# Patient Record
Sex: Male | Born: 1970 | Race: Black or African American | Hispanic: No | Marital: Single | Smoking: Never smoker
Health system: Southern US, Community
[De-identification: ages and names within clinical notes are randomized; demographics above are authoritative.]

## PROBLEM LIST (undated history)

## (undated) HISTORY — PX: OTHER SURGICAL HISTORY: SHX169

---

## 2008-06-24 ENCOUNTER — Emergency Department (HOSPITAL_COMMUNITY): Admission: EM | Admit: 2008-06-24 | Discharge: 2008-06-24 | Payer: Self-pay | Admitting: Emergency Medicine

## 2008-11-05 ENCOUNTER — Emergency Department (HOSPITAL_COMMUNITY): Admission: EM | Admit: 2008-11-05 | Discharge: 2008-11-05 | Payer: Self-pay | Admitting: Emergency Medicine

## 2010-07-07 LAB — POCT I-STAT, CHEM 8
BUN: 16 mg/dL (ref 6–23)
Creatinine, Ser: 1.1 mg/dL (ref 0.4–1.5)
Potassium: 3.6 mEq/L (ref 3.5–5.1)
Sodium: 141 mEq/L (ref 135–145)

## 2010-07-07 LAB — URINALYSIS, ROUTINE W REFLEX MICROSCOPIC
Bilirubin Urine: NEGATIVE
Hgb urine dipstick: NEGATIVE
Specific Gravity, Urine: 1.027 (ref 1.005–1.030)
Urobilinogen, UA: 1 mg/dL (ref 0.0–1.0)
pH: 6.5 (ref 5.0–8.0)

## 2010-07-07 LAB — URINE CULTURE: Colony Count: NO GROWTH

## 2010-07-07 LAB — CBC
Platelets: 297 10*3/uL (ref 150–400)
WBC: 3.9 10*3/uL — ABNORMAL LOW (ref 4.0–10.5)

## 2010-07-07 LAB — DIFFERENTIAL
Lymphocytes Relative: 44 % (ref 12–46)
Lymphs Abs: 1.7 10*3/uL (ref 0.7–4.0)
Neutrophils Relative %: 44 % (ref 43–77)

## 2011-10-05 ENCOUNTER — Ambulatory Visit: Payer: BC Managed Care – PPO | Admitting: Emergency Medicine

## 2011-10-05 ENCOUNTER — Ambulatory Visit (INDEPENDENT_AMBULATORY_CARE_PROVIDER_SITE_OTHER): Payer: BC Managed Care – PPO | Admitting: Emergency Medicine

## 2011-10-05 VITALS — BP 120/80 | HR 88 | Temp 98.3°F | Resp 16 | Ht 67.5 in | Wt 190.0 lb

## 2011-10-05 DIAGNOSIS — M654 Radial styloid tenosynovitis [de Quervain]: Secondary | ICD-10-CM

## 2011-10-05 DIAGNOSIS — M25539 Pain in unspecified wrist: Secondary | ICD-10-CM

## 2011-10-05 NOTE — Progress Notes (Signed)
  Subjective:    Patient ID: Ronnie Michael, male    DOB: 01/04/1971, 41 y.o.   MRN: [id number]  Wrist Pain  The pain is present in the left wrist. This is a new problem. The current episode started in the past 7 days. There has been no history of extremity trauma. The problem occurs constantly. The problem has been gradually worsening. The quality of the pain is described as burning and aching. The pain is moderate. Pertinent negatives include no fever, inability to bear weight, itching, joint locking, joint swelling, limited range of motion, numbness, stiffness or tingling. The symptoms are aggravated by activity. He has tried acetaminophen for the symptoms. The treatment provided no relief. Family history does not include gout or rheumatoid arthritis. There is no history of diabetes, gout, osteoarthritis or rheumatoid arthritis.      Review of Systems  Constitutional: Negative for fever.  Musculoskeletal: Negative for stiffness and gout.  Skin: Negative for itching.  Neurological: Negative for tingling and numbness.  All other systems reviewed and are negative.       Objective:   Physical Exam  Constitutional: He is oriented to person, place, and time. He appears well-developed and well-nourished.  HENT:  Head: Normocephalic and atraumatic.  Eyes: Conjunctivae are normal. Pupils are equal, round, and reactive to light. No scleral icterus.  Neck: Normal range of motion. Neck supple.  Cardiovascular: Normal rate.   Pulmonary/Chest: Effort normal.  Musculoskeletal: He exhibits tenderness (positive Finklestein test).  Neurological: He is alert and oriented to person, place, and time.  Skin: Skin is warm and dry.          Assessment & Plan:  Dequervain's tenosynovitis Thumb spica Anaprox folllow up in one week

## 2011-10-08 ENCOUNTER — Telehealth: Payer: Self-pay

## 2011-10-08 NOTE — Telephone Encounter (Signed)
The patient called to request return call from Dr. Dareen Piano regarding his right wrist injury and his disability claim.  Please call patient at [phone].

## 2011-10-09 NOTE — Telephone Encounter (Signed)
Pt just wanted Dr Dareen Piano to know that employer will be getting in touch w/him regarding his disability. Pt will RTC for f/up on Friday as instr'd.

## 2011-10-11 ENCOUNTER — Ambulatory Visit: Payer: BC Managed Care – PPO

## 2011-10-11 ENCOUNTER — Ambulatory Visit (INDEPENDENT_AMBULATORY_CARE_PROVIDER_SITE_OTHER): Payer: BC Managed Care – PPO | Admitting: Emergency Medicine

## 2011-10-11 VITALS — BP 110/70 | HR 74 | Temp 97.6°F | Resp 16 | Ht 67.5 in | Wt 195.8 lb

## 2011-10-11 DIAGNOSIS — M654 Radial styloid tenosynovitis [de Quervain]: Secondary | ICD-10-CM

## 2011-10-11 NOTE — Progress Notes (Signed)
  Subjective:    Patient ID: Ronnie Michael, male    DOB: 1970/12/22, 41 y.o.   MRN: [id number]  HPI No improvement with one week of immobilization.  Claims not to be using his arm or hand without the splint. Compliant with meds   Review of Systems    As per HPI, otherwise negative.   Objective:   Physical Exam   GEN: WDWN, NAD, Non-toxic, Alert & Oriented x 3 HEENT: Atraumatic, Normocephalic.  Ears and Nose: No external deformity. EXTR: No clubbing/cyanosis/edema NEURO: Normal gait.  PSYCH: Normally interactive. Conversant. Not depressed or anxious appearing.  Calm demeanor.  Wrist tender over right radial aspect.  Positive Finklestein test      Assessment & Plan:  Lollie Sails. Xray Continue spliint  Follow up  Dr Neva Seat 1 week.  [facility] reading (PRIMARY) by  Dr. Dareen Piano.  Negative .

## 2011-10-18 ENCOUNTER — Encounter: Payer: Self-pay | Admitting: Family Medicine

## 2011-10-18 ENCOUNTER — Ambulatory Visit (INDEPENDENT_AMBULATORY_CARE_PROVIDER_SITE_OTHER): Payer: BC Managed Care – PPO | Admitting: Family Medicine

## 2011-10-18 VITALS — BP 108/69 | HR 77 | Temp 97.8°F | Resp 18 | Ht 66.5 in | Wt 186.0 lb

## 2011-10-18 DIAGNOSIS — M654 Radial styloid tenosynovitis [de Quervain]: Secondary | ICD-10-CM

## 2011-10-18 DIAGNOSIS — R209 Unspecified disturbances of skin sensation: Secondary | ICD-10-CM

## 2011-10-18 DIAGNOSIS — R208 Other disturbances of skin sensation: Secondary | ICD-10-CM

## 2011-10-18 DIAGNOSIS — M25539 Pain in unspecified wrist: Secondary | ICD-10-CM

## 2011-10-18 NOTE — Progress Notes (Signed)
  Subjective:    Patient ID: Ronnie Michael, male    DOB: [birth year], 41 y.o.   MRN: [id number]  HPI Ronnie Michael is a 41 y.o. male Seen by Dr. Dareen Piano 10/05/11.- dx with component of Dequervain's tenosynovitis, rx Naprosyn and thumb spica bracing. 7/12 follow up with Dr. Dareen Piano - no improvement. Xray report:There is some deformity of the distal right ulna which appears old and may be related to prior trauma. The radiocarpal joint space is unremarkable. No acute abnormality is seen. Also an old injury of the ligament between the lunate and navicula is a consideration with somewhat widened AC joint space.   Has had R wrist pain few months without apparent injury.  After workout 3 weeks ago - noticed more pain 2 days later, but no acute injury during workout known.  Losing sensation feeling in top of wrist, and into thumb about 2 weeks ago, sensation came back 2 nights ago.  Noticed when moving in shower.  No other injury.  No change in type of work recently.  Sometimes pain better if not moving wrist and in splint. Notices soreness if out of splint.  Pain with any movement.   Taking Naprosyn BID.   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  On short term disability -past 4 days.   Review of Systems  Musculoskeletal: Positive for myalgias (proximal from wrist and into thumb at times. ).  Skin: Negative for rash and wound.  Neurological: Positive for weakness (feels like r hand grasp weaker, and dysesthesias per HPI. ).       Objective:   Physical Exam  Constitutional: He is oriented to person, place, and time. He appears well-developed and well-nourished.  HENT:  Head: Normocephalic and atraumatic.  Pulmonary/Chest: Effort normal.  Musculoskeletal:       Right wrist: He exhibits decreased range of motion, tenderness and bony tenderness. He exhibits no swelling, no effusion and no deformity.       Arms: Neurological: He is alert and oriented to person, place, and time. No sensory  deficit (fingertips warm, cap refill less than 1 second. ).    Prior office visits and xray report reviewed.       Assessment & Plan:  Ronnie Michael is a 41 y.o. male  1. Wrist pain  Ambulatory referral to Orthopedic Surgery  2. Tenosynovitis, de Quervain  Ambulatory referral to Orthopedic Surgery  3. Dysesthesia  Ambulatory referral to Orthopedic Surgery    R wrist pain in multiple areas - scapholunate area - possible old injury with widening on XR.  Also with findings suggestive of Dequervain's, and carpal tunnel syndrome with dysesthesias and tinel's.  Replaced thumb spica fiberglass splint with velcro removable thumb spica splint.  Refer to Hand surgeon/ortho for further eval (+/-MRI, +/- trial of PT) as minimal improvement with almost 2 weeks of immobilization and multiple areas of involvement of pain and reported dyesthesias and weakness.  Can continue naprosyn BID prn, rom BID as tolerated and if recurrence or worsening of dysesthesias - rtc or ER.  Understanding expressed.

## 2011-10-18 NOTE — Patient Instructions (Signed)
We will refer you to an orthopaedic /hand specialist.  Can use wrist brace as needed.  Minimize use of R wrist, but should try to take wrist out of splint twice per day for gentle range of motion.  Ok to continue naprosyn twice per day as needed.  Take this with food. Return to the clinic or go to the nearest emergency room if any of your symptoms worsen or new symptoms occur.

## 2015-11-29 ENCOUNTER — Encounter (HOSPITAL_COMMUNITY): Payer: Self-pay | Admitting: Emergency Medicine

## 2015-11-29 ENCOUNTER — Emergency Department (HOSPITAL_COMMUNITY)
Admission: EM | Admit: 2015-11-29 | Discharge: 2015-11-29 | Disposition: A | Discharge status: Home / Self Care | Payer: Self-pay | Attending: Emergency Medicine | Admitting: Emergency Medicine

## 2015-11-29 ENCOUNTER — Emergency Department (HOSPITAL_COMMUNITY): Payer: Self-pay

## 2015-11-29 DIAGNOSIS — Y990 Civilian activity done for income or pay: Secondary | ICD-10-CM | POA: Insufficient documentation

## 2015-11-29 DIAGNOSIS — M25462 Effusion, left knee: Secondary | ICD-10-CM | POA: Insufficient documentation

## 2015-11-29 DIAGNOSIS — Y929 Unspecified place or not applicable: Secondary | ICD-10-CM | POA: Insufficient documentation

## 2015-11-29 DIAGNOSIS — Y9389 Activity, other specified: Secondary | ICD-10-CM | POA: Insufficient documentation

## 2015-11-29 DIAGNOSIS — X58XXXA Exposure to other specified factors, initial encounter: Secondary | ICD-10-CM | POA: Insufficient documentation

## 2015-11-29 MED ORDER — TRAMADOL HCL 50 MG PO TABS: 50.0000 mg | ORAL_TABLET | Freq: Four times a day (QID) | ORAL | 0 refills | Status: AC | PRN

## 2015-11-29 MED ORDER — HYDROCODONE-ACETAMINOPHEN 5-325 MG PO TABS
1.0000 | ORAL_TABLET | Freq: Once | ORAL | Status: AC
  Administered 2015-11-29: 1 via ORAL
  Filled 2015-11-29: qty 1

## 2015-11-29 MED ORDER — IBUPROFEN 200 MG PO TABS
600.0000 mg | ORAL_TABLET | Freq: Once | ORAL | Status: AC
  Administered 2015-11-29: 600 mg via ORAL
  Filled 2015-11-29: qty 3

## 2015-11-29 MED ORDER — IBUPROFEN 800 MG PO TABS: 800.0000 mg | ORAL_TABLET | Freq: Three times a day (TID) | ORAL | 0 refills | Status: AC

## 2015-11-29 NOTE — ED Notes (Signed)
Bed: WA26 Expected date:  Expected time:  Means of arrival:  Comments: 

## 2015-11-29 NOTE — ED Triage Notes (Signed)
Patient injured his left knee last week in the gym and then again today.  Area is swollen and tender to touch.  Patient rates pain as a 10/10.  Applied ice at home and is taking ibuprofen.

## 2015-11-29 NOTE — ED Provider Notes (Signed)
[facility]-EMERGENCY DEPT Provider Note   CSN: [id number] Arrival date & time: 11/29/15  1148  By signing my name below, I, Freida Busmaniana Omoyeni, attest that this documentation has been prepared under the direction and in the presence of non-physician practitioner, Ebbie Ridgehris Jacquetta Polhamus, PA-C. Electronically Signed: Freida Busmaniana Omoyeni, Scribe. 11/29/2015. 12:27 PM.    History   Chief Complaint Chief Complaint  Patient presents with  . Knee Pain   HPI Comments:  Ronnie Michael is a 45 y.o. male who presents to the Emergency Department complaining of moderate, left knee pain x 1 week. Pt describes his pain as a burning. He reports associated numbness, swelling and tingling in the knee. Patient reports initial injury 1 week ago while at the gym; states he was doing leg extensions. He then  re-injured the knee  at work. He has taken ibuprofen with little relief. The patient has also injured this knee in the past but denies surgery to the knee. His pain is exacerbated when he bears weight.  Pt has no other complaints or symptoms at this time.   The history is provided by the patient. No language interpreter was used.    No past medical history on file.  There are no active problems to display for this patient.   Past Surgical History:  Procedure Laterality Date  . right knee surgery    . right wrist surgery       Home Medications    Prior to Admission medications   Medication Sig Start Date End Date Taking? Authorizing Provider  naproxen sodium (ANAPROX) 550 MG tablet Take 550 mg by mouth 2 (two) times daily with a meal.    Historical Provider, MD    Family History No family history on file.  Social History Social History  Substance Use Topics  . Smoking status: Never Smoker  . Smokeless tobacco: Not on file  . Alcohol use No     Allergies   Review of patient's allergies indicates not on file.   Review of Systems Review of Systems  Musculoskeletal: Positive for arthralgias, joint swelling  and myalgias.  Neurological: Positive for numbness. Negative for weakness.     Physical Exam Updated Vital Signs BP 145/95   Pulse 82   Temp 98.5 F (36.9 C) (Oral)   Resp 16   SpO2 100%   Physical Exam  Constitutional: He is oriented to person, place, and time. He appears well-developed and well-nourished. No distress.  HENT:  Head: Normocephalic and atraumatic.  Eyes: Conjunctivae are normal.  Cardiovascular: Normal rate.   Pulmonary/Chest: Effort normal.  Abdominal: He exhibits no distension.  Musculoskeletal:  Left knee swelling and diffuse pain  Patellar tendon intact Good strength  decreased ROM of knee   Neurological: He is alert and oriented to person, place, and time.  Skin: Skin is warm and dry.  Psychiatric: He has a normal mood and affect.  Nursing note and vitals reviewed.   ED Treatments / Results  DIAGNOSTIC STUDIES:  Oxygen Saturation is 100% on room air, normal by my interpretation.    COORDINATION OF CARE:  12:27 PM Will order  X-ray of left knee. Pt advised to use heat and ice and to elevate the knee.  Discussed treatment plan with pt at bedside and pt agreed to plan.  Labs (all labs ordered are listed, but only abnormal results are displayed) Labs Reviewed - No data to display  EKG  EKG Interpretation None       Radiology Dg Knee Complete 4 Views  Left  Result Date: 11/29/2015 CLINICAL DATA:  45 year old male with 1 week of moderate left knee pain. Swelling and numbness. Pre seeding injury while at the gym. Initial encounter. EXAM: LEFT KNEE - COMPLETE 4+ VIEW COMPARISON:  None. FINDINGS: Small to moderate joint effusion is visible. The patella appears intact, although there is osteophytosis at the distal patella which appears somewhat indistinct. Joint spaces appear relatively preserved. There is mild tricompartmental degenerative spurring. No acute osseous abnormality identified. IMPRESSION: Small to moderate suprapatellar joint effusion.  No acute osseous abnormality identified, but dystrophic calcifications at the distal patella are suspicious for tendinopathy at the origin of the patellar tendon. Electronically Signed   By: Odessa Fleming M.D.   On: 11/29/2015 13:09    Procedures Procedures (including critical care time)  Medications Ordered in ED Medications  HYDROcodone-acetaminophen (NORCO/VICODIN) 5-325 MG per tablet 1 tablet (1 tablet Oral Given 11/29/15 1229)  ibuprofen (ADVIL,MOTRIN) tablet 600 mg (600 mg Oral Given 11/29/15 1229)     Initial Impression / Assessment and Plan / ED Course  I have reviewed the triage vital signs and the nursing notes.  Pertinent labs & imaging results that were available during my care of the patient were reviewed by me and considered in my medical decision making (see chart for details).  Clinical Course     Patient X-Ray negative for obvious fracture or dislocation.  Pt advised to follow up with orthopedics. Patient given knee immobilizer and crutches while in ED, conservative therapy recommended and discussed. Patient will be discharged home & is agreeable with above plan. Returns precautions discussed. Pt appears safe for discharge.   Final Clinical Impressions(s) / ED Diagnoses   Final diagnoses:  None    New Prescriptions New Prescriptions   No medications on file  I personally performed the services described in this documentation, which was scribed in my presence. The recorded information has been reviewed and is accurate.     Charlestine Night, PA-C 11/29/15 0981    Derwood Kaplan, MD 11/29/15 [phone]

## 2015-11-29 NOTE — Discharge Instructions (Signed)
Return here as needed.  Ice and elevate her knee.  Follow-up with the orthopedist provided and

## 2015-12-06 ENCOUNTER — Other Ambulatory Visit (HOSPITAL_COMMUNITY): Payer: Self-pay | Admitting: Physician Assistant

## 2015-12-06 DIAGNOSIS — M25562 Pain in left knee: Secondary | ICD-10-CM

## 2015-12-07 ENCOUNTER — Ambulatory Visit (HOSPITAL_COMMUNITY)
Admission: RE | Admit: 2015-12-07 | Discharge: 2015-12-07 | Disposition: A | Discharge status: Home / Self Care | Payer: Self-pay | Source: Ambulatory Visit | Attending: Physician Assistant | Admitting: Physician Assistant

## 2015-12-07 DIAGNOSIS — M659 Synovitis and tenosynovitis, unspecified: Secondary | ICD-10-CM | POA: Insufficient documentation

## 2015-12-07 DIAGNOSIS — M2540 Effusion, unspecified joint: Secondary | ICD-10-CM | POA: Insufficient documentation

## 2015-12-07 DIAGNOSIS — M25562 Pain in left knee: Secondary | ICD-10-CM | POA: Insufficient documentation

## 2015-12-07 DIAGNOSIS — X58XXXA Exposure to other specified factors, initial encounter: Secondary | ICD-10-CM | POA: Insufficient documentation

## 2015-12-07 DIAGNOSIS — S83242A Other tear of medial meniscus, current injury, left knee, initial encounter: Secondary | ICD-10-CM | POA: Insufficient documentation

## 2016-03-26 ENCOUNTER — Emergency Department (HOSPITAL_COMMUNITY): Payer: No Typology Code available for payment source

## 2016-03-26 ENCOUNTER — Emergency Department (HOSPITAL_COMMUNITY)
Admission: EM | Admit: 2016-03-26 | Discharge: 2016-03-26 | Disposition: A | Discharge status: Home / Self Care | Payer: No Typology Code available for payment source | Attending: Emergency Medicine | Admitting: Emergency Medicine

## 2016-03-26 ENCOUNTER — Encounter (HOSPITAL_COMMUNITY): Payer: Self-pay | Admitting: Emergency Medicine

## 2016-03-26 DIAGNOSIS — M7918 Myalgia, other site: Secondary | ICD-10-CM

## 2016-03-26 DIAGNOSIS — Y92410 Unspecified street and highway as the place of occurrence of the external cause: Secondary | ICD-10-CM | POA: Insufficient documentation

## 2016-03-26 DIAGNOSIS — S060X9A Concussion with loss of consciousness of unspecified duration, initial encounter: Secondary | ICD-10-CM | POA: Diagnosis not present

## 2016-03-26 DIAGNOSIS — S0990XA Unspecified injury of head, initial encounter: Secondary | ICD-10-CM | POA: Diagnosis present

## 2016-03-26 DIAGNOSIS — Y999 Unspecified external cause status: Secondary | ICD-10-CM | POA: Diagnosis not present

## 2016-03-26 DIAGNOSIS — S00412A Abrasion of left ear, initial encounter: Secondary | ICD-10-CM | POA: Insufficient documentation

## 2016-03-26 DIAGNOSIS — Y939 Activity, unspecified: Secondary | ICD-10-CM | POA: Insufficient documentation

## 2016-03-26 MED ORDER — OXYCODONE-ACETAMINOPHEN 5-325 MG PO TABS
1.0000 | ORAL_TABLET | Freq: Once | ORAL | Status: AC
  Administered 2016-03-26: 1 via ORAL
  Filled 2016-03-26: qty 1

## 2016-03-26 MED ORDER — OXYCODONE-ACETAMINOPHEN 5-325 MG PO TABS: 1.0000 | ORAL_TABLET | ORAL | 0 refills | Status: AC | PRN

## 2016-03-26 MED ORDER — CYCLOBENZAPRINE HCL 10 MG PO TABS: 10.0000 mg | ORAL_TABLET | Freq: Three times a day (TID) | ORAL | 0 refills | Status: AC | PRN

## 2016-03-26 MED ORDER — NAPROXEN 500 MG PO TABS: 500.0000 mg | ORAL_TABLET | Freq: Two times a day (BID) | ORAL | 0 refills | Status: AC

## 2016-03-26 MED ORDER — TETANUS-DIPHTH-ACELL PERTUSSIS 5-2.5-18.5 LF-MCG/0.5 IM SUSP
0.5000 mL | Freq: Once | INTRAMUSCULAR | Status: AC
  Administered 2016-03-26: 0.5 mL via INTRAMUSCULAR
  Filled 2016-03-26: qty 0.5

## 2016-03-26 MED ORDER — ONDANSETRON 4 MG PO TBDP
4.0000 mg | ORAL_TABLET | Freq: Once | ORAL | Status: AC
  Administered 2016-03-26: 4 mg via ORAL
  Filled 2016-03-26: qty 1
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
## 2016-03-26 NOTE — Discharge Instructions (Signed)
Read the information below.  Use the prescribed medication as directed.  Please discuss all new medications with your pharmacist.  Do not take additional tylenol while taking the prescribed pain medication to avoid overdose.  You may return to the Emergency Department at any time for worsening condition or any new symptoms that concern you.   If you develop uncontrolled pain, weakness or numbness of the extremity, severe discoloration of the skin, or you are unable to use your arms or walk, return to the ER for a recheck.     You have had a head injury which does not appear to require admission at this time. A concussion is a state of changed mental ability from trauma. SEEK IMMEDIATE MEDICAL ATTENTION IF: There is confusion or drowsiness (although children frequently become drowsy after injury).  You cannot awaken the injured person.  There is nausea (feeling sick to your stomach) or continued, forceful vomiting.  You notice dizziness or unsteadiness which is getting worse, or inability to walk.  You have convulsions or unconsciousness.  You experience severe, persistent headaches not relieved by Tylenol?. (Do not take aspirin as this impairs clotting abilities). Take other pain medications only as directed.  You cannot use arms or legs normally.  There are changes in pupil sizes. (This is the black center in the colored part of the eye)  There is clear or bloody discharge from the nose or ears.  Change in speech, vision, swallowing, or understanding.  Localized weakness, numbness, tingling, or change in bowel or bladder control.

## 2017-12-11 IMAGING — CR DG KNEE COMPLETE 4+V*L*
5 series · 5 of 5 positions shown · non-contrast
Comparison: None.

CLINICAL DATA: 45-year-old male with 1 week of moderate left knee
pain. Swelling and numbness. Pre seeding injury while at the gym.
Initial encounter.

EXAM:
LEFT KNEE - COMPLETE 4+ VIEW

[t knee ap left]
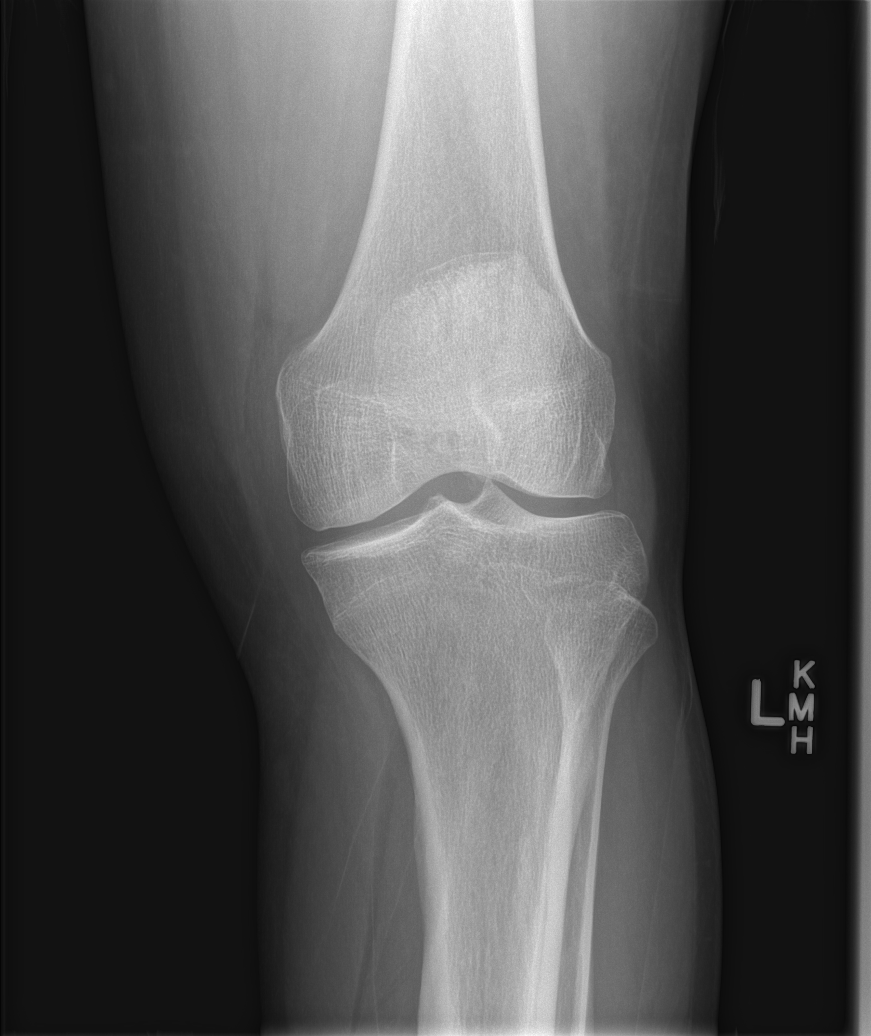

[t knee obl left (1 of 2)]
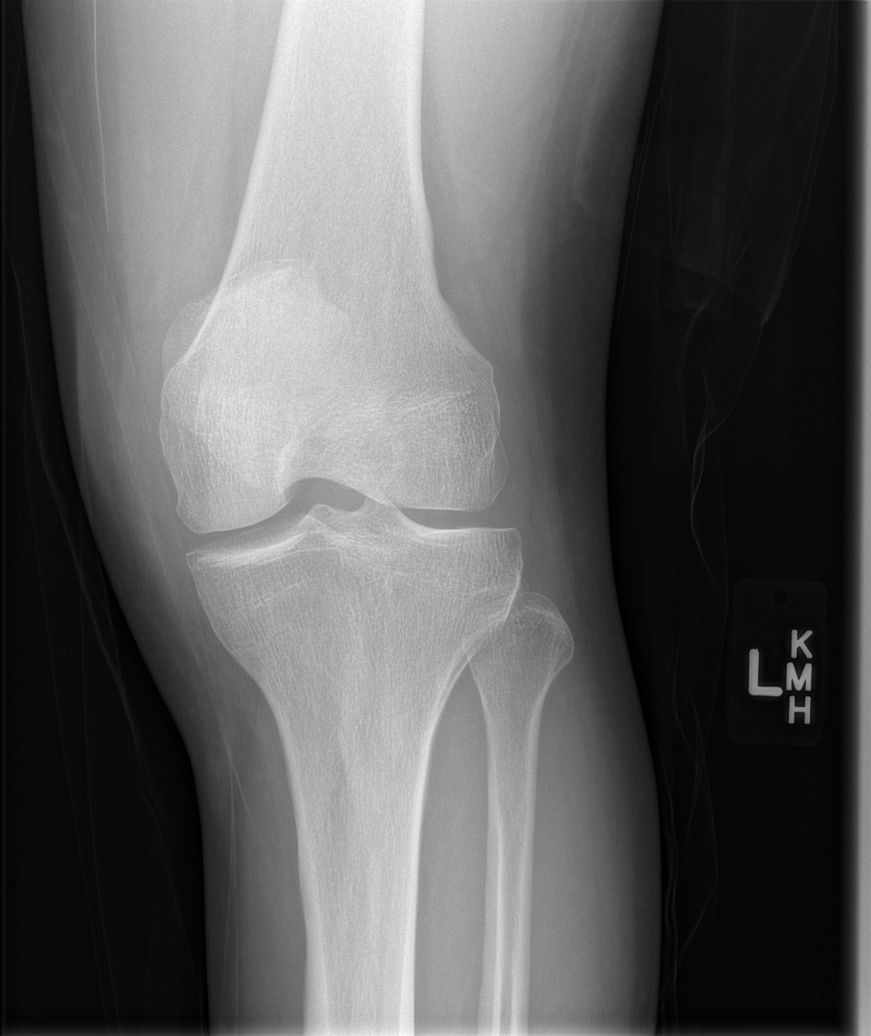

[t knee obl left (2 of 2)]
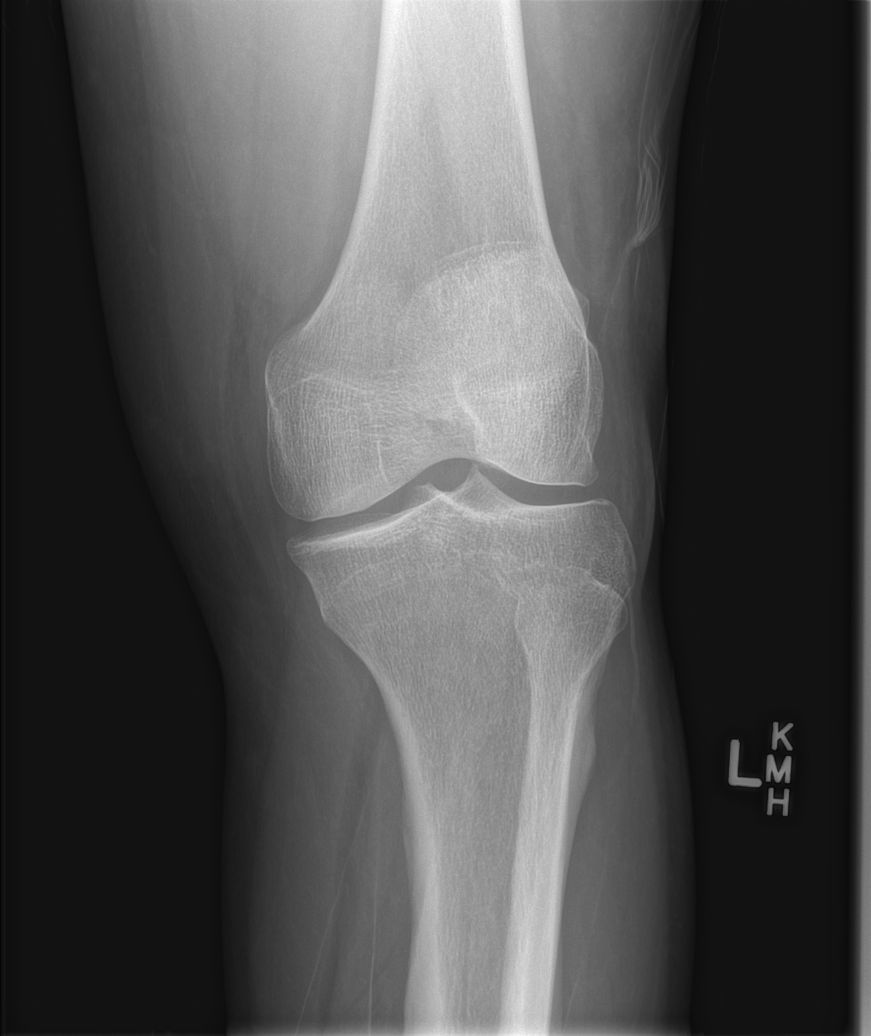

[t knee lat left (1 of 2)]
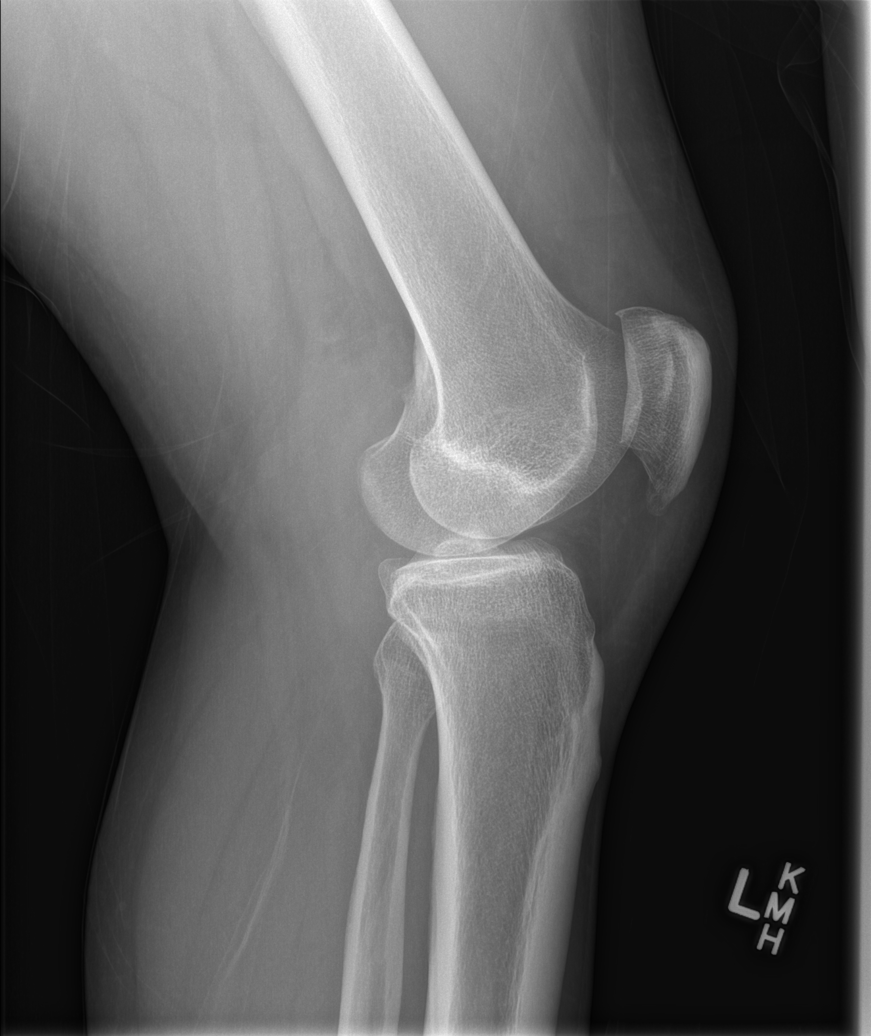

[t knee lat left (2 of 2)]
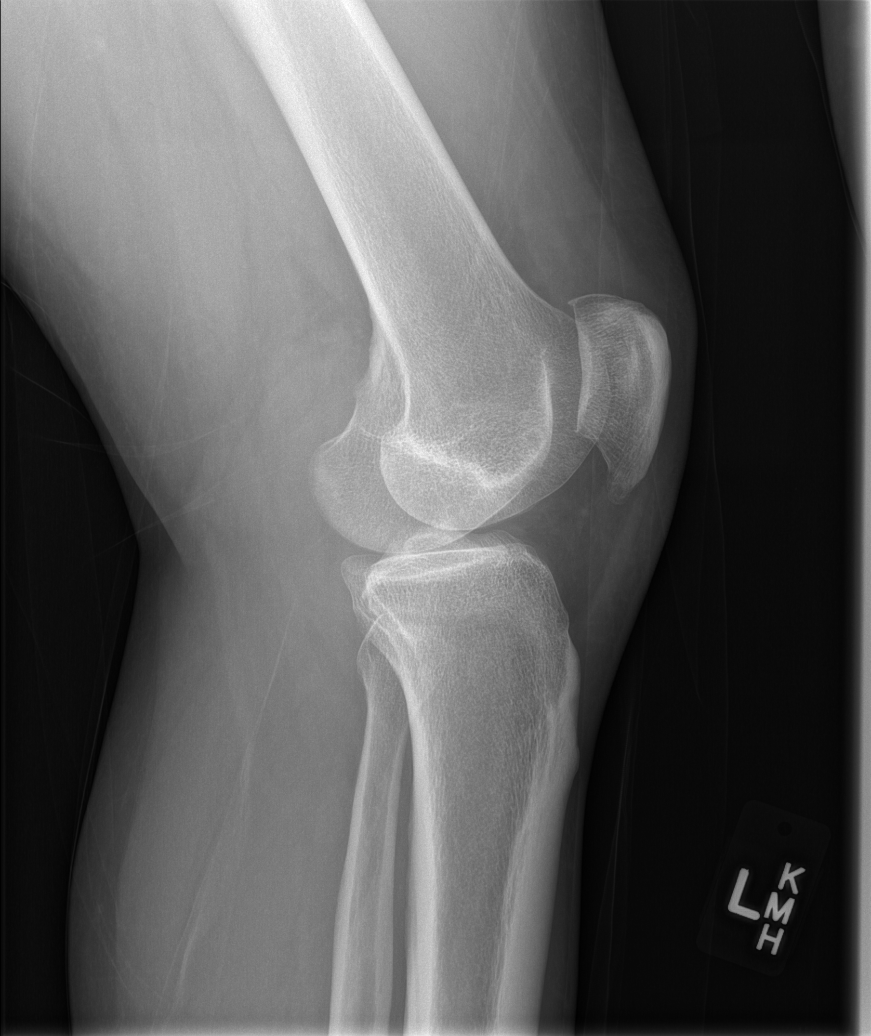

[5 of 5 positions shown; findings below may reference images not displayed]

FINDINGS: Small to moderate joint effusion is visible. The patella appears
intact, although there is osteophytosis at the distal patella which
appears somewhat indistinct. Joint spaces appear relatively
preserved. There is mild tricompartmental degenerative spurring. No
acute osseous abnormality identified.
IMPRESSION: Small to moderate suprapatellar joint effusion.

No acute osseous abnormality identified, but dystrophic
calcifications at the distal patella are suspicious for tendinopathy
at the origin of the patellar tendon.
